# Patient Record
Sex: Male | Born: 2000 | Race: White | Hispanic: Yes | Marital: Single | Smoking: Never smoker
Health system: Southern US, Community
[De-identification: ages and names within clinical notes are randomized; demographics above are authoritative.]

---
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
## 2020-01-25 ENCOUNTER — Other Ambulatory Visit: Payer: Self-pay

## 2020-01-25 ENCOUNTER — Encounter (HOSPITAL_COMMUNITY): Payer: Self-pay

## 2020-01-25 ENCOUNTER — Emergency Department (HOSPITAL_COMMUNITY)
Admission: EM | Admit: 2020-01-25 | Discharge: 2020-01-25 | Disposition: A | Discharge status: Home / Self Care | Payer: BC Managed Care – PPO | Attending: Emergency Medicine | Admitting: Emergency Medicine

## 2020-01-25 ENCOUNTER — Ambulatory Visit (HOSPITAL_COMMUNITY)
Admission: EM | Admit: 2020-01-25 | Discharge: 2020-01-25 | Disposition: A | Discharge status: Home / Self Care | Payer: BC Managed Care – PPO | Attending: Family Medicine | Admitting: Family Medicine

## 2020-01-25 ENCOUNTER — Encounter (HOSPITAL_COMMUNITY): Payer: Self-pay | Admitting: Emergency Medicine

## 2020-01-25 DIAGNOSIS — Y929 Unspecified place or not applicable: Secondary | ICD-10-CM | POA: Insufficient documentation

## 2020-01-25 DIAGNOSIS — Y939 Activity, unspecified: Secondary | ICD-10-CM | POA: Diagnosis not present

## 2020-01-25 DIAGNOSIS — S90561A Insect bite (nonvenomous), right ankle, initial encounter: Secondary | ICD-10-CM | POA: Diagnosis not present

## 2020-01-25 DIAGNOSIS — L03115 Cellulitis of right lower limb: Secondary | ICD-10-CM | POA: Diagnosis not present

## 2020-01-25 DIAGNOSIS — R6883 Chills (without fever): Secondary | ICD-10-CM | POA: Diagnosis not present

## 2020-01-25 DIAGNOSIS — Y999 Unspecified external cause status: Secondary | ICD-10-CM | POA: Diagnosis not present

## 2020-01-25 DIAGNOSIS — Z5321 Procedure and treatment not carried out due to patient leaving prior to being seen by health care provider: Secondary | ICD-10-CM | POA: Insufficient documentation

## 2020-01-25 DIAGNOSIS — M549 Dorsalgia, unspecified: Secondary | ICD-10-CM | POA: Diagnosis not present

## 2020-01-25 DIAGNOSIS — W57XXXA Bitten or stung by nonvenomous insect and other nonvenomous arthropods, initial encounter: Secondary | ICD-10-CM

## 2020-01-25 MED ORDER — DOXYCYCLINE HYCLATE 100 MG PO TABS
ORAL_TABLET | ORAL | Status: AC
Start: 2020-01-25 — End: ?
  Filled 2020-01-25: qty 1

## 2020-01-25 MED ORDER — DOXYCYCLINE HYCLATE 100 MG PO CAPS: 100.0000 mg | ORAL_CAPSULE | Freq: Two times a day (BID) | ORAL | 0 refills | Status: DC

## 2020-01-25 MED ORDER — DOXYCYCLINE HYCLATE 100 MG PO TABS
100.0000 mg | ORAL_TABLET | Freq: Once | ORAL | Status: AC
  Administered 2020-01-25: 100 mg via ORAL

## 2020-01-25 NOTE — ED Triage Notes (Signed)
Patient arrives to ED with complaints of getting bit by a tick on his inner right ankle. Since the bite patient has had chills and some back tightness.

## 2020-01-25 NOTE — ED Provider Notes (Signed)
[facility]   [id number] 01/25/20 Arrival Time: 1811  CC: RASH  SUBJECTIVE:  Erik Knox is a 19 y.o. male who presents with a skin complaint that began 3 days ago. Reports that his partner pulled a tick off of his right inner ankle. Describes it as itchy, red and draining. Has not taken OTC medications for this. Symptoms are made worse with scratching. Denies similar symptoms in the past. Denies fever, chills, nausea, vomiting, erythema, swelling, discharge, oral lesions, SOB, chest pain, abdominal pain, changes in bowel or bladder function.    ROS: As per HPI.  All other pertinent ROS negative.     History reviewed. No pertinent past medical history. History reviewed. No pertinent surgical history. No Known Allergies No current facility-administered medications on file prior to encounter.   Current Outpatient Medications on File Prior to Encounter  Medication Sig Dispense Refill  . acetaminophen (TYLENOL) 500 MG tablet Take 500 mg by mouth every 6 (six) hours as needed for mild pain, fever or headache.     . clindamycin (CLEOCIN) 300 MG capsule Take 1 capsule (300 mg total) by mouth 3 (three) times daily. X 10 days 30 capsule 0  . ibuprofen (ADVIL,MOTRIN) 200 MG tablet Take 400 mg by mouth every 6 (six) hours as needed for fever, headache, mild pain or moderate pain.    . mupirocin ointment (BACTROBAN) 2 % Apply 1 application topically 3 (three) times daily. 22 g 1   Social History   Socioeconomic History  . Marital status: Single    Spouse name: Not on file  . Number of children: Not on file  . Years of education: Not on file  . Highest education level: Not on file  Occupational History  . Not on file  Tobacco Use  . Smoking status: Never Smoker  . Smokeless tobacco: Never Used  Substance and Sexual Activity  . Alcohol use: No  . Drug use: No  . Sexual activity: Never  Other Topics Concern  . Not on file  Social History Narrative  . Not on file    Social Determinants of Health   Financial Resource Strain:   . Difficulty of Paying Living Expenses:   Food Insecurity:   . Worried About Programme researcher, broadcasting/film/video in the Last Year:   . Barista in the Last Year:   Transportation Needs:   . Freight forwarder (Medical):   Marland Kitchen Lack of Transportation (Non-Medical):   Physical Activity:   . Days of Exercise per Week:   . Minutes of Exercise per Session:   Stress:   . Feeling of Stress :   Social Connections:   . Frequency of Communication with Friends and Family:   . Frequency of Social Gatherings with Friends and Family:   . Attends Religious Services:   . Active Member of Clubs or Organizations:   . Attends Banker Meetings:   Marland Kitchen Marital Status:   Intimate Partner Violence:   . Fear of Current or Ex-Partner:   . Emotionally Abused:   Marland Kitchen Physically Abused:   . Sexually Abused:    History reviewed. No pertinent family history.  OBJECTIVE: Vitals:   01/25/20 1844  BP: 139/76  Pulse: 93  Resp: 12  Temp: 98.5 F (36.9 C)  SpO2: 100%    General appearance: alert; no distress Head: NCAT Lungs: clear to auscultation bilaterally Heart: regular rate and rhythm.  Radial pulse 2+ bilaterally Extremities: no edema Skin: warm and dry; .5cm  erythematous raised area with pinpoint puncture to the center of the area. Draining honey colored fluid. Mildly warm and tender  Psychological: alert and cooperative; normal mood and affect  ASSESSMENT & PLAN:  1. Cellulitis of right lower extremity   2. Tick bite, initial encounter     Meds ordered this encounter  Medications  . DISCONTD: doxycycline (VIBRAMYCIN) 100 MG capsule    Sig: Take 1 capsule (100 mg total) by mouth 2 (two) times daily.    Dispense:  14 capsule    Refill:  0    Order Specific Question:   Supervising Provider    Answer:   Chase Picket [id number]  . doxycycline (VIBRAMYCIN) 100 MG capsule    Sig: Take 1 capsule (100 mg total) by mouth 2  (two) times daily.    Dispense:  14 capsule    Refill:  0    Order Specific Question:   Supervising Provider    Answer:   Chase Picket [id number]  . doxycycline (VIBRA-TABS) tablet 100 mg   Cellulitis Tick Bite  Doxycycline dose given in office, patient does not have much money and has good rx coupon for [employer] but they are closed for tonight Doxycycline prescribed Take as prescribed and to completion Avoid hot showers/ baths Moisturize skin daily  Follow up with PCP if symptoms persists Return or go to the ER if you have any new or worsening symptoms such as fever, chills, nausea, vomiting, redness, swelling, discharge, if symptoms do not improve with medications  Reviewed expectations re: course of current medical issues. Questions answered. Outlined signs and symptoms indicating need for more acute intervention. Patient verbalized understanding. After Visit Summary given.   Faustino Congress, NP 01/25/20 1916

## 2020-01-25 NOTE — Discharge Instructions (Addendum)
You are experiencing cellulitis from the tick bite  I have sent in doxycycline for you to take twice daily for 7 days  You have received a dose of doxycycline here in the office tonight  If you are not feeling better at the end of your course of antibiotics, follow up with this office

## 2020-01-25 NOTE — ED Triage Notes (Signed)
Patient reports he was bit by a tick on his inner right ankle. Reports this morning he woke up with fatigue and reports itching at the site.

## 2020-03-14 ENCOUNTER — Emergency Department (HOSPITAL_COMMUNITY)
Admission: EM | Admit: 2020-03-14 | Discharge: 2020-03-15 | Disposition: A | Discharge status: Home / Self Care | Payer: BC Managed Care – PPO | Attending: Emergency Medicine | Admitting: Emergency Medicine

## 2020-03-14 ENCOUNTER — Emergency Department (HOSPITAL_COMMUNITY): Payer: BC Managed Care – PPO

## 2020-03-14 ENCOUNTER — Other Ambulatory Visit: Payer: Self-pay

## 2020-03-14 DIAGNOSIS — M6282 Rhabdomyolysis: Secondary | ICD-10-CM | POA: Diagnosis not present

## 2020-03-14 DIAGNOSIS — R531 Weakness: Secondary | ICD-10-CM | POA: Diagnosis present

## 2020-03-14 DIAGNOSIS — R110 Nausea: Secondary | ICD-10-CM | POA: Diagnosis not present

## 2020-03-14 DIAGNOSIS — R5383 Other fatigue: Secondary | ICD-10-CM | POA: Insufficient documentation

## 2020-03-14 DIAGNOSIS — E876 Hypokalemia: Secondary | ICD-10-CM | POA: Diagnosis not present

## 2020-03-14 LAB — CBC
HCT: 45 % (ref 39.0–52.0)
Hemoglobin: 15.9 g/dL (ref 13.0–17.0)
MCH: 30.9 pg (ref 26.0–34.0)
MCHC: 35.3 g/dL (ref 30.0–36.0)
MCV: 87.4 fL (ref 80.0–100.0)
Platelets: 206 10*3/uL (ref 150–400)
RBC: 5.15 MIL/uL (ref 4.22–5.81)
RDW: 11 % — ABNORMAL LOW (ref 11.5–15.5)
WBC: 9.5 10*3/uL (ref 4.0–10.5)
nRBC: 0 % (ref 0.0–0.2)

## 2020-03-14 LAB — BASIC METABOLIC PANEL
Anion gap: 13 (ref 5–15)
BUN: 19 mg/dL (ref 6–20)
CO2: 24 mmol/L (ref 22–32)
Calcium: 10 mg/dL (ref 8.9–10.3)
Chloride: 102 mmol/L (ref 98–111)
Creatinine, Ser: 0.8 mg/dL (ref 0.61–1.24)
GFR calc Af Amer: >60 mL/min (ref >60)
GFR calc non Af Amer: >60 mL/min (ref >60)
Glucose, Bld: 117 mg/dL — ABNORMAL HIGH (ref 70–99)
Potassium: 2.9 mmol/L — ABNORMAL LOW (ref 3.5–5.1)
Sodium: 139 mmol/L (ref 135–145)

## 2020-03-14 LAB — CK: Total CK: 1060 U/L — ABNORMAL HIGH (ref 49–397)

## 2020-03-14 MED ORDER — SODIUM CHLORIDE 0.9 % IV BOLUS
2000.0000 mL | Freq: Once | INTRAVENOUS | Status: AC
  Administered 2020-03-15: 2000 mL via INTRAVENOUS

## 2020-03-14 MED ORDER — POTASSIUM CHLORIDE CRYS ER 20 MEQ PO TBCR
40.0000 meq | EXTENDED_RELEASE_TABLET | Freq: Once | ORAL | Status: AC
  Administered 2020-03-15: 40 meq via ORAL
  Filled 2020-03-14: qty 2

## 2020-03-14 NOTE — ED Provider Notes (Addendum)
Patient placed in Quick Look pathway, seen and evaluated   Chief Complaint: Weakness; concern for heat stroke   HPI:   Patient recently started working in [employer], spending all day in the sun.  Started to feel weak, hot, body aches, nausea.  States he walks miles at work.  Not drinking enough water. No CP, endorses occasional SOB  ROS:  Nauseous, weak,   Physical Exam:   Gen: No distress  Neuro: Awake and Alert  Skin: Warm    Focused Exam: Alert and oriented x 4, NAD, non diaphoretic, chest rise equal and unlabored.    Initiation of care has begun. The patient has been counseled on the process, plan, and necessity for staying for the completion/evaluation, and the remainder of the medical screening examination    Leone Brand 03/14/20 2034    Charlynne Pander, MD 03/14/20 2243

## 2020-03-14 NOTE — ED Provider Notes (Signed)
[facility] EMERGENCY DEPARTMENT Provider Note   CSN: [id number] Arrival date & time: 03/14/20  2013    History Chief Complaint  Patient presents with  . Dehydration   Erik Knox is a 19 y.o. male with no significant past medical history who presents for evaluation of weakness and concerns for heat stroke. 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 He has worked the last 3 days. Had tried to increased fluids however when returning to work feels hot, weak, lightheaded, muscles cramping and nausea. No HA, vision changes, neck pain, slurred speech, CP, SOB, cough, abd pain, dark urine. Has not taken anything for symptoms at home. Denies additional aggravating or alleviating factors.  Symptoms resolved on my initial evaluation.  History obtained from patient and past medical records. No interpretor was used.  HPI     No past medical history on file.  Patient Active Problem List   Diagnosis Date Noted  . 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     No family history on file.  Social History   Tobacco Use  . Smoking status: Never Smoker  . Smokeless tobacco: Never Used  Substance Use Topics  . Alcohol use: No  . Drug use: No    Home Medications Prior to Admission medications   Medication Sig Start Date End Date Taking? Authorizing Provider  acetaminophen (TYLENOL) 500 MG tablet Take 500 mg by mouth every 6 (six) hours as needed for mild pain, fever or headache.     [provider]  clindamycin (CLEOCIN) 300 MG capsule Take 1 capsule (300 mg total) by mouth 3 (three) times daily. X 10 days 12/24/15   Lowanda Foster, NP  doxycycline (VIBRAMYCIN) 100 MG capsule Take 1 capsule (100 mg total) by mouth 2 (two) times daily. 01/25/20   Moshe Cipro, NP  ibuprofen (ADVIL,MOTRIN) 200 MG tablet Take 400 mg by mouth every 6 (six) hours as needed for fever, headache, mild pain or moderate pain.    [provider]  mupirocin ointment (BACTROBAN) 2 % Apply 1 application topically 3 (three) times daily. 12/24/15   Lowanda Foster, NP    Allergies    Patient has no known allergies.  Review of Systems   Review of Systems  Constitutional: Positive for activity change and fatigue.  HENT: Negative.   Respiratory: Negative.   Cardiovascular: Negative.   Gastrointestinal: Positive for nausea. Negative for abdominal distention, abdominal pain, anal bleeding, blood in stool, constipation, diarrhea, rectal pain and vomiting.  Genitourinary: Negative.   Musculoskeletal: Positive for myalgias. Negative for arthralgias, back pain, gait problem, joint swelling, neck pain and neck stiffness.  Skin: Negative.   Neurological: Positive for light-headedness. Negative for dizziness, tremors, seizures, syncope, facial asymmetry, speech difficulty, weakness, numbness and headaches.  All other systems reviewed and are negative.   Physical Exam Updated Vital Signs BP 137/76 (BP Location: Right Arm)   Pulse 100   Temp 98.5 F (36.9 C) (Oral)   Resp 16   Ht 5\' 7"  (1.702 m)   Wt 68 kg   SpO2 100%   BMI 23.49 kg/m   Physical Exam Vitals and nursing note reviewed.  Constitutional:      General: He is not in acute distress.    Appearance: He is well-developed. He is not ill-appearing, toxic-appearing or diaphoretic.  HENT:     Head: Normocephalic and atraumatic.     Nose: Nose normal.     Mouth/Throat:  Mouth: Mucous membranes are moist.  Eyes:     Pupils: Pupils are equal, round, and reactive to light.  Cardiovascular:     Rate and Rhythm: Normal rate and regular rhythm.     Pulses: Normal pulses.     Heart sounds: Normal heart sounds.  Pulmonary:     Effort: Pulmonary effort is normal. No respiratory distress.     Breath sounds: Normal breath sounds.  Abdominal:     General: Bowel sounds are normal. There is no distension.     Palpations: Abdomen is soft.     Tenderness: There is no  abdominal tenderness. There is no right CVA tenderness, left CVA tenderness, guarding or rebound.  Musculoskeletal:        General: No swelling, tenderness, deformity or signs of injury. Normal range of motion.     Cervical back: Normal range of motion and neck supple.     Right lower leg: No edema.     Left lower leg: No edema.  Skin:    General: Skin is warm and dry.     Capillary Refill: Capillary refill takes less than 2 seconds.  Neurological:     General: No focal deficit present.     Mental Status: He is alert and oriented to person, place, and time.     ED Results / Procedures / Treatments   Labs (all labs ordered are listed, but only abnormal results are displayed) Labs Reviewed  CK - Abnormal; Notable for the following components:      Result Value   Total CK 1,060 (*)    All other components within normal limits  CBC - Abnormal; Notable for the following components:   RDW 11.0 (*)    All other components within normal limits  BASIC METABOLIC PANEL - Abnormal; Notable for the following components:   Potassium 2.9 (*)    Glucose, Bld 117 (*)    All other components within normal limits    EKG None  Radiology DG Chest Portable 1 View  Result Date: 03/14/2020 CLINICAL DATA:  Shortness of breath EXAM: PORTABLE CHEST 1 VIEW COMPARISON:  10/04/2015 FINDINGS: The heart size and mediastinal contours are within normal limits. Both lungs are clear. The visualized skeletal structures are unremarkable. IMPRESSION: Negative. Electronically Signed   By: Charlett Nose M.D.   On: 03/14/2020 22:00   Procedures Procedures (including critical care time)  Medications Ordered in ED Medications  sodium chloride 0.9 % bolus 2,000 mL (0 mLs Intravenous Stopped 03/15/20 0127)  potassium chloride SA (KLOR-CON) CR tablet 40 mEq (40 mEq Oral Given 03/15/20 0128)   ED Course  I have reviewed the triage vital signs and the nursing notes.  Pertinent labs & imaging results that were  available during my care of the patient were reviewed by me and considered in my medical decision making (see chart for details).  Patient with concerns for heat injury after starting new job outside. Afebrile, non septic non ill appearing. Heart and lungs clear. Abdomen soft. NV intact. Normal MSK exam. Had some lightheadedness, cramping, sweating and nausea while working outside in the heat. Symptoms resolved prior to my evaluation.  Labs obtained from triage personally reviewed and interpreted: CBC without leukocytosis BMP with hypokalemia at 2.9, will replace, Hyperglycemia at 117 CK 1060 DG chest without acute infiltrates, pulmonary edema, pneumothorax, cardiomegaly.  Patient reassessed. Has gotten 2 L IVF. Tolerating PO intake without difficulty. Will have him follow up with PCP in 1 week for repeat labs. Prefers  to not have PO potasium replacement. Discussed potassium rich foods he can try at home  The patient has been appropriately medically screened and/or stabilized in the ED. I have low suspicion for any other emergent medical condition which would require further screening, evaluation or treatment in the ED or require inpatient management.  Patient is hemodynamically stable and in no acute distress.  Patient able to ambulate in department prior to ED.  Evaluation does not show acute pathology that would require ongoing or additional emergent interventions while in the emergency department or further inpatient treatment.  I have discussed the diagnosis with the patient and answered all questions.  Pain is been managed while in the emergency department and patient has no further complaints prior to discharge.  Patient is comfortable with plan discussed in room and is stable for discharge at this time.  I have discussed strict return precautions for returning to the emergency department.  Patient was encouraged to follow-up with PCP/specialist refer to at discharge.    MDM  Rules/Calculators/A&P                           Final Clinical Impression(s) / ED Diagnoses Final diagnoses:  Non-traumatic rhabdomyolysis  Hypokalemia    Rx / DC Orders ED Discharge Orders    None       Jhase Creppel A, PA-C 03/15/20 0153    Gilda Crease, MD 03/15/20 [phone]

## 2020-03-14 NOTE — ED Triage Notes (Signed)
Pt started working at [employer] on Monday, and began feeling bad today; c/o nausea, SOB, thinks he may have had a "heat stroke" and c/o dehydrations.

## 2020-03-15 NOTE — Discharge Instructions (Addendum)
You need to have labs redrawn in 1 week for a recheck of your potassium.  Return for new or worsening symptoms

## 2020-12-13 ENCOUNTER — Encounter (HOSPITAL_COMMUNITY): Payer: Self-pay | Admitting: Emergency Medicine

## 2020-12-13 ENCOUNTER — Ambulatory Visit (HOSPITAL_COMMUNITY)
Admission: EM | Admit: 2020-12-13 | Discharge: 2020-12-13 | Disposition: A | Discharge status: Home / Self Care | Payer: BC Managed Care – PPO

## 2020-12-13 ENCOUNTER — Other Ambulatory Visit: Payer: Self-pay

## 2020-12-13 DIAGNOSIS — R21 Rash and other nonspecific skin eruption: Secondary | ICD-10-CM

## 2020-12-13 DIAGNOSIS — L239 Allergic contact dermatitis, unspecified cause: Secondary | ICD-10-CM

## 2020-12-13 MED ORDER — PREDNISONE 10 MG PO TABS: ORAL_TABLET | ORAL | 0 refills | Status: DC

## 2020-12-13 MED ORDER — TRIAMCINOLONE ACETONIDE 0.1 % EX CREA: 1.0000 "application " | TOPICAL_CREAM | Freq: Two times a day (BID) | CUTANEOUS | 1 refills | Status: DC

## 2020-12-13 NOTE — ED Triage Notes (Signed)
Hive intermittently for a month.  Patient has a video on phone.  Rash appears on trunk and extremities.  Patient says at times he feels he has to "breathe a little harder".  Patient has been taking claritin, zyrtec, hydrocortisone cream.  Patient reports a cough

## 2020-12-13 NOTE — Discharge Instructions (Signed)
Take zyrtec twice daily until resolved. Follow up with Primary Care or Dermatology if recurring

## 2020-12-13 NOTE — ED Provider Notes (Signed)
[facility]    CSN: [id number] Arrival date & time: 12/13/20  1137      History   Chief Complaint Chief Complaint  Patient presents with  . Urticaria    HPI Erik Knox is a 20 y.o. male.   Patient presenting today with 1 month history of intermittent rash that started on his legs and is now across most of his body.  Very itchy when having a flareup.  Does resolve between flares almost completely.  Tends to be worse when he lays down at night in bed.  Does note that right before onset his wife switched laundry detergents but otherwise no new products, foods, medications he is aware of.  He denies chest pain, shortness of breath, fever, chills, nausea, vomiting, trouble breathing or swallowing.  Has been taking antihistamines and hydrocortisone which does temporarily help with symptoms.  No past history of similar issues.     History reviewed. No pertinent past medical history.  Patient Active Problem List   Diagnosis Date Noted  . 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 No pertinent surgical history.     Home Medications    Prior to Admission medications   Medication Sig Start Date End Date Taking? Authorizing Provider  cetirizine (ZYRTEC) 10 MG tablet Take 10 mg by mouth daily.   Yes [provider]  hydrocortisone cream 0.5 % Apply 1 application topically 2 (two) times daily.   Yes [provider]  loratadine (CLARITIN) 10 MG tablet Take 10 mg by mouth daily.   Yes [provider]  predniSONE (DELTASONE) 10 MG tablet Take 6 tabs daily x 2 days, 5 tabs daily x 2 days, 4 tabs daily x 2 days, etc 12/13/20  Yes Particia Nearing, PA-C  triamcinolone cream (KENALOG) 0.1 % Apply 1 application topically 2 (two) times daily. 12/13/20  Yes Particia Nearing, PA-C  acetaminophen (TYLENOL) 500 MG tablet Take 500 mg by mouth every 6 (six) hours as needed for mild pain, fever or headache.  Patient not taking: Reported on  12/13/2020    [provider]  clindamycin (CLEOCIN) 300 MG capsule Take 1 capsule (300 mg total) by mouth 3 (three) times daily. X 10 days Patient not taking: Reported on 12/13/2020 12/24/15   Lowanda Foster, NP  doxycycline (VIBRAMYCIN) 100 MG capsule Take 1 capsule (100 mg total) by mouth 2 (two) times daily. Patient not taking: Reported on 12/13/2020 01/25/20   Moshe Cipro, NP  ibuprofen (ADVIL,MOTRIN) 200 MG tablet Take 400 mg by mouth every 6 (six) hours as needed for fever, headache, mild pain or moderate pain. Patient not taking: Reported on 12/13/2020    [provider]  mupirocin ointment (BACTROBAN) 2 % Apply 1 application topically 3 (three) times daily. Patient not taking: Reported on 12/13/2020 12/24/15   Lowanda Foster, NP    Family History History reviewed. No pertinent family history.  Social History Social History   Tobacco Use  . Smoking status: Never Smoker  . Smokeless tobacco: Never Used  Vaping Use  . Vaping Use: Some days  Substance Use Topics  . Alcohol use: No  . Drug use: No     Allergies   Patient has no known allergies.   Review of Systems Review of Systems Per HPI Physical Exam Triage Vital Signs ED Triage Vitals  Enc Vitals Group     BP 12/13/20 1228 (!) 149/89     Pulse Rate 12/13/20 1228 89     Resp  12/13/20 1228 20     Temp 12/13/20 1228 98.3 F (36.8 C)     Temp Source 12/13/20 1228 Oral     SpO2 12/13/20 1228 98 %     Weight --      Height --      Head Circumference --      Peak Flow --      Pain Score 12/13/20 1223 0     Pain Loc --      Pain Edu? --      Excl. in GC? --    No data found.  Updated Vital Signs BP (!) 149/89 (BP Location: Right Arm)   Pulse 89   Temp 98.3 F (36.8 C) (Oral)   Resp 20   SpO2 98%   Visual Acuity Right Eye Distance:   Left Eye Distance:   Bilateral Distance:    Right Eye Near:   Left Eye Near:    Bilateral Near:     Physical Exam Vitals and nursing note  reviewed.  Constitutional:      Appearance: Normal appearance.  HENT:     Head: Atraumatic.     Right Ear: Tympanic membrane normal.     Left Ear: Tympanic membrane normal.     Nose: Nose normal.     Mouth/Throat:     Mouth: Mucous membranes are moist.     Pharynx: Oropharynx is clear.  Eyes:     Extraocular Movements: Extraocular movements intact.     Conjunctiva/sclera: Conjunctivae normal.  Cardiovascular:     Rate and Rhythm: Normal rate and regular rhythm.     Heart sounds: Normal heart sounds.  Pulmonary:     Effort: Pulmonary effort is normal.     Breath sounds: Normal breath sounds.  Abdominal:     General: Bowel sounds are normal. There is no distension.     Palpations: Abdomen is soft.     Tenderness: There is no abdominal tenderness. There is no guarding.  Musculoskeletal:        General: Normal range of motion.     Cervical back: Normal range of motion and neck supple.  Skin:    General: Skin is warm and dry.     Findings: Rash present.     Comments: Lingering erythematous urticarial patches across all 4 extremities, trunk.  Not as severe as in pictures and videos he has on his phone from earlier today  Neurological:     General: No focal deficit present.     Mental Status: He is oriented to person, place, and time.  Psychiatric:        Mood and Affect: Mood normal.        Thought Content: Thought content normal.        Judgment: Judgment normal.      UC Treatments / Results  Labs (all labs ordered are listed, but only abnormal results are displayed) Labs Reviewed - No data to display  EKG   Radiology No results found.  Procedures Procedures (including critical care time)  Medications Ordered in UC Medications - No data to display  Initial Impression / Assessment and Plan / UC Course  I have reviewed the triage vital signs and the nursing notes.  Pertinent labs & imaging results that were available during my care of the patient were reviewed  by me and considered in my medical decision making (see chart for details).     Consistent with allergic rash from unknown trigger, possibly from large detergent change.  Discussed extended prednisone taper, triamcinolone cream, antihistamines twice daily until resolved.  Switch all products to unscented, hypoallergenic and keep track of food diary and supplements for rule out.  Follow-up with primary care and possibly allergist if recurring  Final Clinical Impressions(s) / UC Diagnoses   Final diagnoses:  Rash  Allergic dermatitis     Discharge Instructions     Take zyrtec twice daily until resolved. Follow up with Primary Care or Dermatology if recurring    ED Prescriptions    Medication Sig Dispense Auth. Provider   predniSONE (DELTASONE) 10 MG tablet Take 6 tabs daily x 2 days, 5 tabs daily x 2 days, 4 tabs daily x 2 days, etc 42 tablet Particia Nearing, PA-C   triamcinolone cream (KENALOG) 0.1 % Apply 1 application topically 2 (two) times daily. 90 g Particia Nearing, [place]     PDMP not reviewed this encounter.   Particia Nearing, [place] 12/13/20 1324

## 2022-01-03 ENCOUNTER — Other Ambulatory Visit: Payer: Self-pay

## 2022-01-03 ENCOUNTER — Encounter: Payer: Self-pay | Admitting: Emergency Medicine

## 2022-01-03 ENCOUNTER — Ambulatory Visit
Admission: EM | Admit: 2022-01-03 | Discharge: 2022-01-03 | Disposition: A | Discharge status: Home / Self Care | Payer: Self-pay | Attending: Emergency Medicine | Admitting: Emergency Medicine

## 2022-01-03 DIAGNOSIS — M549 Dorsalgia, unspecified: Secondary | ICD-10-CM

## 2022-01-03 DIAGNOSIS — E860 Dehydration: Secondary | ICD-10-CM

## 2022-01-03 MED ORDER — IBUPROFEN 600 MG PO TABS: 600.0000 mg | ORAL_TABLET | Freq: Four times a day (QID) | ORAL | 0 refills | Status: AC | PRN

## 2022-01-03 MED ORDER — BACLOFEN 10 MG PO TABS: 10.0000 mg | ORAL_TABLET | Freq: Three times a day (TID) | ORAL | 0 refills | Status: AC

## 2022-01-03 NOTE — ED Triage Notes (Signed)
Patient states that he worked a 15 hour shift yesterday.  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  Patient states that he woke up this morning with pain in his back, head, and legs.  Patient states that he tried to go to work around 12 pm and started to feel very tired and vomited once.

## 2022-01-03 NOTE — ED Provider Notes (Signed)
[facility]    CSN: [id number] Arrival date & time: 01/03/22  1358      History   Chief Complaint Chief Complaint  Patient presents with   Back Pain   Fatigue    HPI Erik Knox is a 21 y.o. male.   HPI  21 year old male here for evaluation of multiple complaints.  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  When he woke up this morning he was having pain in his back, head, and legs.  He states that he tried to go to work but he started to feel bad.  He reports that he started to breathe shallow and then developed numbness in his face, arms, and legs.  He drank some Gatorade and then came here for evaluation.  He reports that he had a heatstroke approximately a year and a half ago and he wanted to be evaluated.  History reviewed. No pertinent past medical history.  Patient Active Problem List   Diagnosis Date Noted   PLANTAR WART 07/20/2008    History reviewed. No pertinent surgical history.     Home Medications    Prior to Admission medications   Medication Sig Start Date End Date Taking? Authorizing Provider  baclofen (LIORESAL) 10 MG tablet Take 1 tablet (10 mg total) by mouth 3 (three) times daily. 01/03/22  Yes Becky Augusta, NP  ibuprofen (ADVIL) 600 MG tablet Take 1 tablet (600 mg total) by mouth every 6 (six) hours as needed. 01/03/22  Yes Becky Augusta, NP  cetirizine (ZYRTEC) 10 MG tablet Take 10 mg by mouth daily.    [provider]  loratadine (CLARITIN) 10 MG tablet Take 10 mg by mouth daily.    [provider]    Family History History reviewed. No pertinent family history.  Social History Social History   Tobacco Use   Smoking status: Never   Smokeless tobacco: Never  Vaping Use   Vaping Use: Former  Substance Use Topics   Alcohol use: No   Drug use: No     Allergies   Patient has no known allergies.   Review of Systems Review of Systems  Constitutional:   Positive for fatigue.  Musculoskeletal:  Positive for myalgias.  Neurological:  Positive for numbness. Negative for dizziness and headaches.  Hematological: Negative.   Psychiatric/Behavioral: Negative.      Physical Exam Triage Vital Signs ED Triage Vitals  Enc Vitals Group     BP 01/03/22 1503 126/87     Pulse Rate 01/03/22 1503 (!) 126     Resp 01/03/22 1503 16     Temp 01/03/22 1503 99.8 F (37.7 C)     Temp Source 01/03/22 1503 Oral     SpO2 01/03/22 1503 100 %     Weight 01/03/22 1500 170 lb (77.1 kg)     Height 01/03/22 1500 5\' 6"  (1.676 m)     Head Circumference --      Peak Flow --      Pain Score 01/03/22 1500 9     Pain Loc --      Pain Edu? --      Excl. in GC? --    No data found.  Updated Vital Signs BP 126/87 (BP Location: Left Arm)   Pulse (!) 126   Temp 99.8 F (37.7 C) (Oral)   Resp 16   Ht 5\' 6"  (1.676 m)   Wt 170 lb (77.1 kg)   SpO2 100%  BMI 27.44 kg/m   Visual Acuity Right Eye Distance:   Left Eye Distance:   Bilateral Distance:    Right Eye Near:   Left Eye Near:    Bilateral Near:     Physical Exam Vitals and nursing note reviewed.  Constitutional:      Appearance: Normal appearance. He is not ill-appearing.  HENT:     Head: Normocephalic and atraumatic.     Right Ear: Tympanic membrane, ear canal and external ear normal. There is no impacted cerumen.     Left Ear: Tympanic membrane, ear canal and external ear normal. There is no impacted cerumen.     Nose: Nose normal. No congestion or rhinorrhea.     Mouth/Throat:     Mouth: Mucous membranes are moist.     Pharynx: Oropharynx is clear. No oropharyngeal exudate or posterior oropharyngeal erythema.  Cardiovascular:     Rate and Rhythm: Normal rate and regular rhythm.     Pulses: Normal pulses.     Heart sounds: Normal heart sounds. No murmur heard.   No friction rub. No gallop.  Pulmonary:     Effort: Pulmonary effort is normal.     Breath sounds: Normal breath sounds. No  wheezing, rhonchi or rales.  Musculoskeletal:     Cervical back: Normal range of motion and neck supple.  Lymphadenopathy:     Cervical: No cervical adenopathy.  Skin:    General: Skin is warm and dry.     Capillary Refill: Capillary refill takes less than 2 seconds.     Findings: No erythema or rash.  Neurological:     General: No focal deficit present.     Mental Status: He is alert and oriented to person, place, and time.  Psychiatric:        Mood and Affect: Mood normal.        Behavior: Behavior normal.        Thought Content: Thought content normal.        Judgment: Judgment normal.     UC Treatments / Results  Labs (all labs ordered are listed, but only abnormal results are displayed) Labs Reviewed - No data to display  EKG   Radiology No results found.  Procedures Procedures (including critical care time)  Medications Ordered in UC Medications - No data to display  Initial Impression / Assessment and Plan / UC Course  I have reviewed the triage vital signs and the nursing notes.  Pertinent labs & imaging results that were available during my care of the patient were reviewed by me and considered in my medical decision making (see chart for details).  Patient is a nontoxic-appearing 21 year old male here for evaluation of muscle pain after working hard out in the heat yesterday.  He does have a history of a heatstroke urine a half ago where he had a syncopal episode and had to be treated in the ER for electrolyte imbalance and severe dehydration.  He reports that he worked 15 hours in the heat yesterday shoveling and then he went back to work this morning.  He has not had much in the way of oral fluids in the last 2 days.  He states that he had 1 bottle of water today and 1 bottle of Gatorade.  He is mildly tachycardic at 126 and his temperature is mildly elevated at 99.8.  He reports that he feels like he is developing a sore throat as well.  Physical exam reveals  pearly-gray tympanic membranes bilaterally with  normal light reflex and clear external auditory canals.  Nasal mucosa is pink and moist without erythema, edema, or discharge.  Oropharyngeal exam is benign.  No cervical lymphadenopathy on exam.  Cardiopulmonary exam reveals S1-S2 heart sounds with regular rate and rhythm and lung sounds that are clear to auscultation in all fields.  Patient does have tenderness and muscle tension in his cervical, thoracic, and lumbar paraspinous regions bilaterally.  He is moving all extremities without any difficulty.  I do believe he is mildly dehydrated given his elevated temp and heart rate but he does not have any overt dehydration as he has good skin turgor, moist and bright sclera, and his oral mucous membranes are not sticky.  I will give him a work note to excuse him from work today and have him go home and rest in the air conditioning where he can stay cool.  I have encouraged him to continue doing this throughout the weekend and to increase his oral fluid intake with a goal of a gallon of water a day.  I have also informed him that when he is working in the heat he needs to drink closer to his body weight in ounces and he needs to also utilize electrolyte replacement solutions such as Gatorade, Powerade, or liquid IV to help replace sodium and potassium that he is losing in his sweat.  I will give him ibuprofen and baclofen to help with the muscle tension in his back as well as home physical therapy exercises to do.   Final Clinical Impressions(s) / UC Diagnoses   Final diagnoses:  Acute bilateral back pain, unspecified back location  Dehydration after exertion     Discharge Instructions      Take the ibuprofen, 600 mg every 6 hours with food, on a schedule for the next 48 hours and then as needed.  Take the baclofen, 10 mg every 8 hours, on a schedule for the next 48 hours and then as needed.  Apply moist heat to your back for 30 minutes at a time 2-3  times a day to improve blood flow to the area and help remove the lactic acid causing the spasm.  Follow the back exercises given at discharge.  Rest indoors and stay out of the heat this weekend.  Increase your oral fluid intake to a goal of 1 gallon daily. When working in the heat you need to drink your body weight in ounces.This needs to be mixed with an electrolyte replacement solution like liquid IV.   Return for reevaluation for any new or worsening symptoms.      ED Prescriptions     Medication Sig Dispense Auth. Provider   ibuprofen (ADVIL) 600 MG tablet Take 1 tablet (600 mg total) by mouth every 6 (six) hours as needed. 30 tablet Becky Augusta, NP   baclofen (LIORESAL) 10 MG tablet Take 1 tablet (10 mg total) by mouth 3 (three) times daily. 30 each Becky Augusta, NP      PDMP not reviewed this encounter.   Becky Augusta, NP 01/03/22 1540

## 2022-01-03 NOTE — Discharge Instructions (Addendum)
Take the ibuprofen, 600 mg every 6 hours with food, on a schedule for the next 48 hours and then as needed.  Take the baclofen, 10 mg every 8 hours, on a schedule for the next 48 hours and then as needed.  Apply moist heat to your back for 30 minutes at a time 2-3 times a day to improve blood flow to the area and help remove the lactic acid causing the spasm.  Follow the back exercises given at discharge.  Rest indoors and stay out of the heat this weekend.  Increase your oral fluid intake to a goal of 1 gallon daily. When working in the heat you need to drink your body weight in ounces.This needs to be mixed with an electrolyte replacement solution like liquid IV.   Return for reevaluation for any new or worsening symptoms.

## 2022-02-15 ENCOUNTER — Ambulatory Visit (HOSPITAL_COMMUNITY)
Admission: EM | Admit: 2022-02-15 | Discharge: 2022-02-15 | Disposition: A | Discharge status: Home / Self Care | Payer: Self-pay | Attending: Internal Medicine | Admitting: Internal Medicine

## 2022-02-15 ENCOUNTER — Other Ambulatory Visit: Payer: Self-pay

## 2022-02-15 ENCOUNTER — Encounter (HOSPITAL_COMMUNITY): Payer: Self-pay | Admitting: Emergency Medicine

## 2022-02-15 DIAGNOSIS — U071 COVID-19: Secondary | ICD-10-CM | POA: Insufficient documentation

## 2022-02-15 NOTE — ED Notes (Signed)
Covid swab in lab

## 2022-02-15 NOTE — ED Triage Notes (Signed)
Reports performing a home covid test today and it is positive.    Symptoms started Wednesday.  Throat felt scratchy.  Yesterday symptoms worsened: fever, chills body aches, headaches  Took ibuprofen.  Reports minimal relief.    For the past 2 weeks, family members have been testing positive for covid.    Patient reports getting only on covid vaccine.

## 2022-02-15 NOTE — Discharge Instructions (Signed)
You have COVID-19 which is a virus that will have to run its course and is treated symptomatically.  Recommend medications that we discussed in urgent care today.  COVID test is pending.  We will call if it is positive.

## 2022-02-15 NOTE — ED Provider Notes (Signed)
[facility]    CSN: [id number] Arrival date & time: 02/15/22  1113      History   Chief Complaint Chief Complaint  Patient presents with   covid positive    HPI Erik Knox is a 21 y.o. male.   Patient presents for further evaluation after testing positive for covid with an at home test today. Patient states that he has 4-day history of scratchy throat, fever, chills, nasal congestion, generalized body aches, headache, cough.  Patient has taken ibuprofen with minimal improvement of symptoms.  Patient reports his family members tested positive for COVID as well.  Denies chest pain, shortness of breath, nausea, vomiting, diarrhea, abdominal pain.  Tmax at home was 102.     History reviewed. No pertinent past medical history.  Patient Active Problem List   Diagnosis Date Noted   PLANTAR WART 07/20/2008    History reviewed. No pertinent surgical history.     Home Medications    Prior to Admission medications   Medication Sig Start Date End Date Taking? Authorizing Provider  baclofen (LIORESAL) 10 MG tablet Take 1 tablet (10 mg total) by mouth 3 (three) times daily. Patient not taking: Reported on 02/15/2022 01/03/22   Becky Augusta, NP  cetirizine (ZYRTEC) 10 MG tablet Take 10 mg by mouth daily. Patient not taking: Reported on 02/15/2022    [provider]  ibuprofen (ADVIL) 600 MG tablet Take 1 tablet (600 mg total) by mouth every 6 (six) hours as needed. 01/03/22   Becky Augusta, NP  loratadine (CLARITIN) 10 MG tablet Take 10 mg by mouth daily. Patient not taking: Reported on 02/15/2022    [provider]    Family History History reviewed. No pertinent family history.  Social History Social History   Tobacco Use   Smoking status: Never   Smokeless tobacco: Never  Vaping Use   Vaping Use: Former  Substance Use Topics   Alcohol use: No   Drug use: No     Allergies   Patient has no known allergies.   Review of Systems Review  of Systems Per HPI  Physical Exam Triage Vital Signs ED Triage Vitals  Enc Vitals Group     BP 02/15/22 1202 126/80     Pulse Rate 02/15/22 1202 (!) 108     Resp 02/15/22 1202 18     Temp 02/15/22 1202 99.4 F (37.4 C)     Temp Source 02/15/22 1202 Oral     SpO2 02/15/22 1202 98 %     Weight --      Height --      Head Circumference --      Peak Flow --      Pain Score 02/15/22 1200 7     Pain Loc --      Pain Edu? --      Excl. in GC? --    No data found.  Updated Vital Signs BP 126/80 (BP Location: Right Arm)   Pulse (!) 108   Temp 99.4 F (37.4 C) (Oral)   Resp 18   SpO2 98%   Visual Acuity Right Eye Distance:   Left Eye Distance:   Bilateral Distance:    Right Eye Near:   Left Eye Near:    Bilateral Near:     Physical Exam Constitutional:      General: He is not in acute distress.    Appearance: Normal appearance. He is not toxic-appearing or diaphoretic.  HENT:     Head:  Normocephalic and atraumatic.     Right Ear: Tympanic membrane and ear canal normal.     Left Ear: Tympanic membrane and ear canal normal.     Nose: Congestion present.     Mouth/Throat:     Mouth: Mucous membranes are moist.     Pharynx: No posterior oropharyngeal erythema.  Eyes:     Extraocular Movements: Extraocular movements intact.     Conjunctiva/sclera: Conjunctivae normal.     Pupils: Pupils are equal, round, and reactive to light.  Cardiovascular:     Rate and Rhythm: Normal rate and regular rhythm.     Pulses: Normal pulses.     Heart sounds: Normal heart sounds.  Pulmonary:     Effort: Pulmonary effort is normal. No respiratory distress.     Breath sounds: Normal breath sounds. No stridor. No wheezing, rhonchi or rales.  Abdominal:     General: Abdomen is flat. Bowel sounds are normal.     Palpations: Abdomen is soft.  Musculoskeletal:        General: Normal range of motion.     Cervical back: Normal range of motion.  Skin:    General: Skin is warm and dry.   Neurological:     General: No focal deficit present.     Mental Status: He is alert and oriented to person, place, and time. Mental status is at baseline.  Psychiatric:        Mood and Affect: Mood normal.        Behavior: Behavior normal.      UC Treatments / Results  Labs (all labs ordered are listed, but only abnormal results are displayed) Labs Reviewed  SARS CORONAVIRUS 2 (TAT 6-24 HRS)    EKG   Radiology No results found.  Procedures Procedures (including critical care time)  Medications Ordered in UC Medications - No data to display  Initial Impression / Assessment and Plan / UC Course  I have reviewed the triage vital signs and the nursing notes.  Pertinent labs & imaging results that were available during my care of the patient were reviewed by me and considered in my medical decision making (see chart for details).     Patient tested positive for COVID-19 with an at home test.  Patient requesting repeat testing.  COVID PCR pending.  Discussed antiviral medications and symptomatic treatment with patient.  Patient declined antiviral medications or any prescriptions to be sent to the pharmacy for him.  Patient wishes to treat symptomatically with over-the-counter medications.  Discussed supportive care with patient.  Fever monitoring and management discussed with patient.  Discussed strict return and ER precautions.  Patient verbalized understanding and was agreeable with plan. Final Clinical Impressions(s) / UC Diagnoses   Final diagnoses:  COVID-19     Discharge Instructions      You have COVID-19 which is a virus that will have to run its course and is treated symptomatically.  Recommend medications that we discussed in urgent care today.  COVID test is pending.  We will call if it is positive.    ED Prescriptions   None    PDMP not reviewed this encounter.   Gustavus Bryant, [place] 02/15/22 1220

## 2022-02-17 LAB — SARS CORONAVIRUS 2 (TAT 6-24 HRS): SARS Coronavirus 2: POSITIVE — AB

## 2022-03-24 IMAGING — DX DG CHEST 1V PORT
1 series · 1 of 1 positions shown · non-contrast
Comparison: 10/04/2015

CLINICAL DATA: Shortness of breath

EXAM:
PORTABLE CHEST 1 VIEW

[chest]
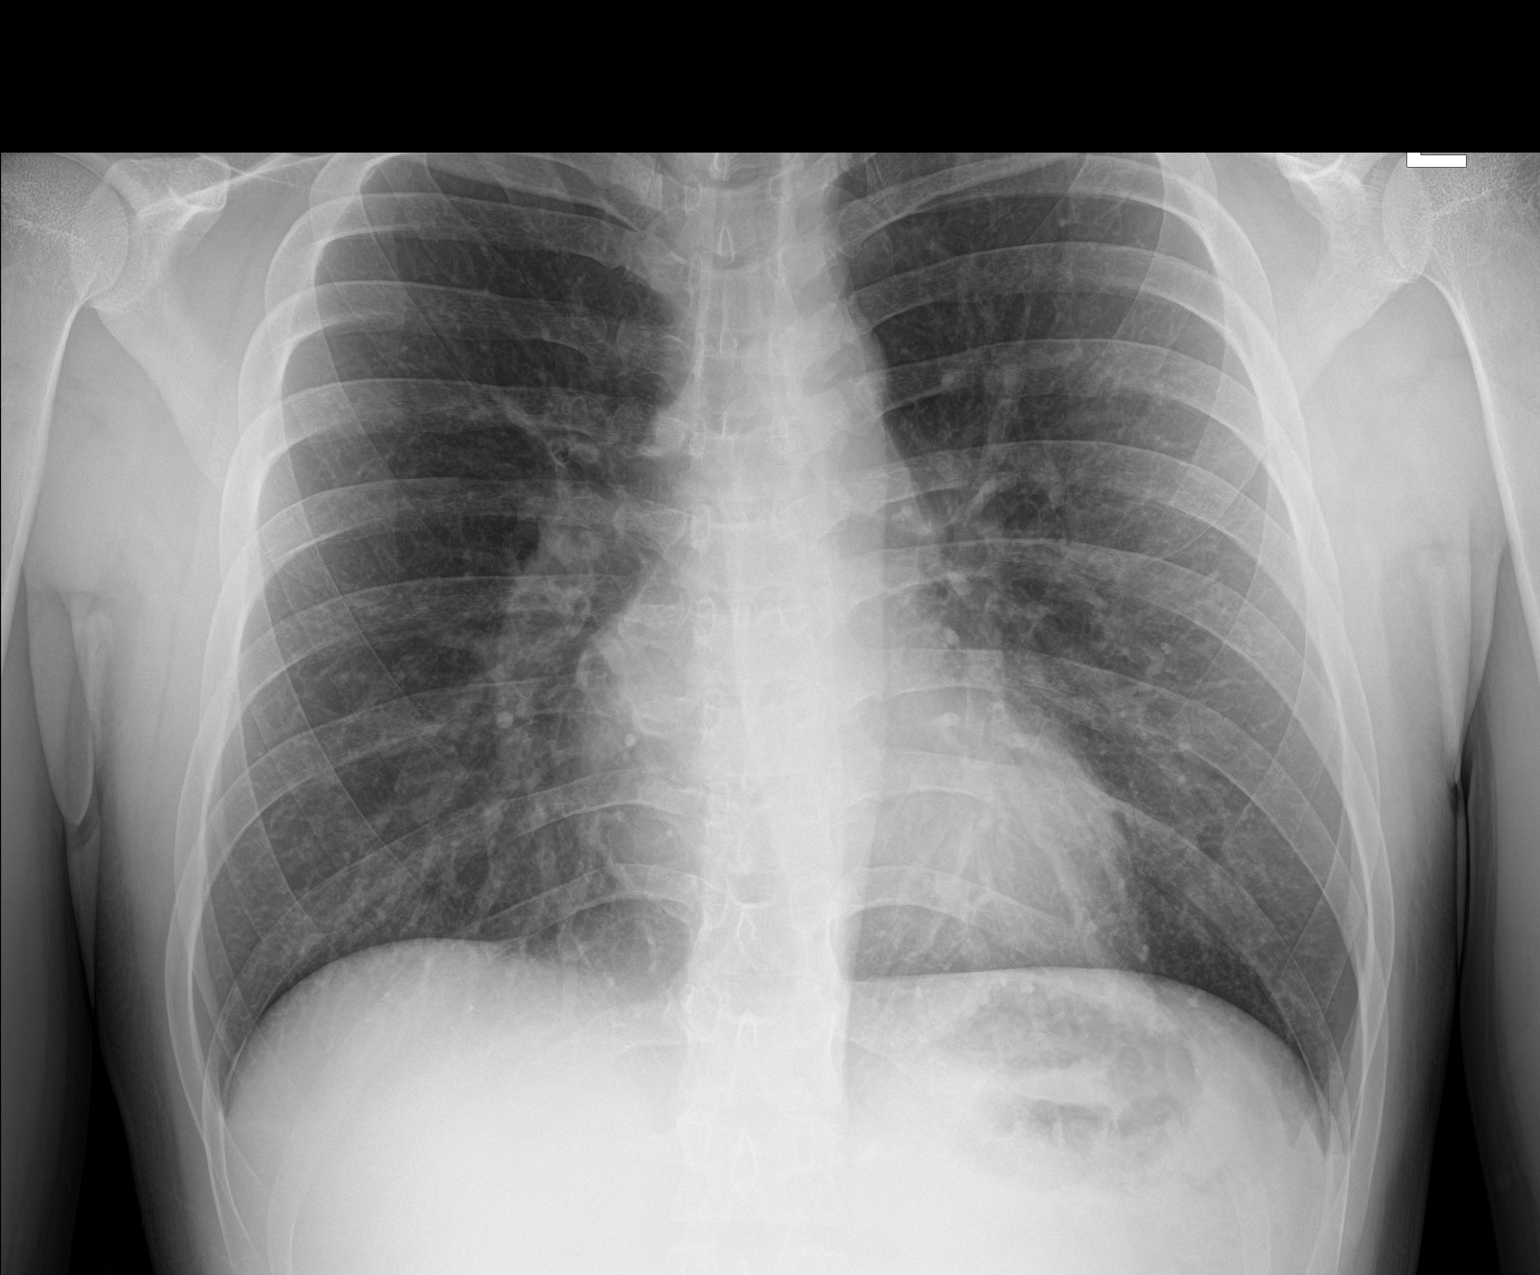

[1 of 1 positions shown; findings below may reference images not displayed]

FINDINGS: The heart size and mediastinal contours are within normal limits.
Both lungs are clear. The visualized skeletal structures are
unremarkable.
IMPRESSION: Negative.

## 2022-09-16 ENCOUNTER — Encounter (HOSPITAL_COMMUNITY): Payer: Self-pay | Admitting: Emergency Medicine

## 2022-09-16 ENCOUNTER — Ambulatory Visit (HOSPITAL_COMMUNITY)
Admission: EM | Admit: 2022-09-16 | Discharge: 2022-09-16 | Disposition: A | Discharge status: Home / Self Care | Payer: Self-pay | Attending: Emergency Medicine | Admitting: Emergency Medicine

## 2022-09-16 DIAGNOSIS — H9203 Otalgia, bilateral: Secondary | ICD-10-CM

## 2022-09-16 DIAGNOSIS — J22 Unspecified acute lower respiratory infection: Secondary | ICD-10-CM

## 2022-09-16 MED ORDER — DOXYCYCLINE HYCLATE 100 MG PO CAPS: 100.0000 mg | ORAL_CAPSULE | Freq: Two times a day (BID) | ORAL | 0 refills | Status: AC

## 2022-09-16 NOTE — ED Triage Notes (Signed)
Pt reports for 3-4 days having sinus pain, congestion, bilateral ear pain. Took tylenol last night that did help some.

## 2022-09-16 NOTE — Discharge Instructions (Addendum)
Rest,push fluids, take antibiotics as directed. May alternate tylenol/ibuprofen as label directed. Follow up with PCP. Return to Er for new or worsening issues

## 2022-09-16 NOTE — ED Provider Notes (Signed)
[place]    CSN: [id number] Arrival date & time: 09/16/22  1353      History   Chief Complaint Chief Complaint  Patient presents with   Otalgia   Facial Pain    HPI Erik Knox is a 22 y.o. male.   22 year old male pt, Erik Knox, presents to urgent care chief complaint of sinus pain and pressure, congestion, bilateral ear pain x 1 week.  Patient took Tylenol last night that did help with symptoms.  The history is provided by the patient. No language interpreter was used.    History reviewed. No pertinent past medical history.  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 No pertinent surgical history.     Home Medications    Prior to Admission medications   Medication Sig Start Date End Date Taking? Authorizing Provider  doxycycline (VIBRAMYCIN) 100 MG capsule Take 1 capsule (100 mg total) by mouth 2 (two) times daily. [id number]  Yes Athan Casalino, Jeanett Schlein, NP  baclofen (LIORESAL) 10 MG tablet Take 1 tablet (10 mg total) by mouth 3 (three) times daily. Patient not taking: Reported on 02/15/2022 01/03/22   Margarette Canada, NP  cetirizine (ZYRTEC) 10 MG tablet Take 10 mg by mouth daily. Patient not taking: Reported on 02/15/2022    [provider]  ibuprofen (ADVIL) 600 MG tablet Take 1 tablet (600 mg total) by mouth every 6 (six) hours as needed. 01/03/22   Margarette Canada, NP  loratadine (CLARITIN) 10 MG tablet Take 10 mg by mouth daily. Patient not taking: Reported on 02/15/2022    [provider]    Family History No family history on file.  Social History Social History   Tobacco Use   Smoking status: Never   Smokeless tobacco: Never  Vaping Use   Vaping Use: Former  Substance Use Topics   Alcohol use: No   Drug use: No     Allergies   Patient has no known allergies.   Review of Systems Review  of Systems  Constitutional:  Positive for fever.  HENT:  Positive for congestion, ear pain, sinus pressure and sinus pain.   Respiratory:  Negative for cough.   All other systems reviewed and are negative.    Physical Exam Triage Vital Signs ED Triage Vitals  Enc Vitals Group     BP 09/16/22 1506 121/80     Pulse Rate 09/16/22 1506 60     Resp 09/16/22 1506 14     Temp 09/16/22 1506 98.5 F (36.9 C)     Temp Source 09/16/22 1506 Oral     SpO2 09/16/22 1506 98 %     Weight --      Height --      Head Circumference --      Peak Flow --      Pain Score 09/16/22 1505 6     Pain Loc --      Pain Edu? --      Excl. in [place]? --    No data found.  Updated Vital Signs BP 121/80 (BP Location: Right Arm)   Pulse 60   Temp 98.5 F (36.9 C) (Oral)   Resp 14   SpO2 98%   Visual Acuity Right Eye Distance:   Left Eye Distance:   Bilateral Distance:    Right Eye Near:   Left  Eye Near:    Bilateral Near:     Physical Exam Vitals and nursing note reviewed.  Constitutional:      General: He is not in acute distress.    Appearance: He is well-developed and well-groomed.  HENT:     Head: Normocephalic and atraumatic.     Right Ear: Tympanic membrane is retracted.     Left Ear: Tympanic membrane is retracted.     Nose: Congestion present.     Right Sinus: Maxillary sinus tenderness present.     Left Sinus: Maxillary sinus tenderness present.     Mouth/Throat:     Lips: Pink.     Mouth: Mucous membranes are moist.     Pharynx: Oropharynx is clear.  Eyes:     General: Lids are normal. Vision grossly intact.     Extraocular Movements: Extraocular movements intact.     Conjunctiva/sclera: Conjunctivae normal.  Cardiovascular:     Rate and Rhythm: Normal rate and regular rhythm.     Pulses: Normal pulses.     Heart sounds: Normal heart sounds. No murmur heard. Pulmonary:     Effort: Pulmonary effort is normal. No respiratory distress.     Breath sounds: Normal breath sounds  and air entry.  Abdominal:     Palpations: Abdomen is soft.     Tenderness: There is no abdominal tenderness.  Musculoskeletal:        General: No swelling.     Cervical back: Neck supple.  Skin:    General: Skin is warm and dry.     Capillary Refill: Capillary refill takes less than 2 seconds.  Neurological:     General: No focal deficit present.     Mental Status: He is alert and oriented to person, place, and time.     GCS: GCS eye subscore is 4. GCS verbal subscore is 5. GCS motor subscore is 6.  Psychiatric:        Attention and Perception: Attention normal.        Mood and Affect: Mood normal.        Speech: Speech normal.        Behavior: Behavior normal. Behavior is cooperative.      UC Treatments / Results  Labs (all labs ordered are listed, but only abnormal results are displayed) Labs Reviewed - No data to display  EKG   Radiology No results found.  Procedures Procedures (including critical care time)  Medications Ordered in UC Medications - No data to display  Initial Impression / Assessment and Plan / UC Course  I have reviewed the triage vital signs and the nursing notes.  Pertinent labs & imaging results that were available during my care of the patient were reviewed by me and considered in my medical decision making (see chart for details).     Ddx: Acute URI, maxillary sinusitis, viral illness, allergies Final Clinical Impressions(s) / UC Diagnoses   Final diagnoses:  Acute respiratory infection  Otalgia of both ears     Discharge Instructions      Rest,push fluids, take antibiotics as directed. May alternate tylenol/ibuprofen as label directed. Follow up with PCP. Return to Er for new or worsening issues    ED Prescriptions     Medication Sig Dispense Auth. Provider   doxycycline (VIBRAMYCIN) 100 MG capsule Take 1 capsule (100 mg total) by mouth 2 (two) times daily. 20 capsule Lyndel Sarate, Jeanett Schlein, NP      PDMP not reviewed this  encounter.   Zeev Deakins, Jeanett Schlein,  NP 09/16/22 1816
